# Patient Record
Sex: Female | Born: 1935 | Race: White | Hispanic: No | Smoking: Former smoker
Health system: Southern US, Community
[De-identification: ages and names within clinical notes are randomized; demographics above are authoritative.]

## PROBLEM LIST (undated history)

## (undated) DIAGNOSIS — R079 Chest pain, unspecified: Secondary | ICD-10-CM

## (undated) DIAGNOSIS — R0602 Shortness of breath: Secondary | ICD-10-CM

## (undated) DIAGNOSIS — I739 Peripheral vascular disease, unspecified: Secondary | ICD-10-CM

## (undated) DIAGNOSIS — E785 Hyperlipidemia, unspecified: Secondary | ICD-10-CM

## (undated) DIAGNOSIS — I10 Essential (primary) hypertension: Secondary | ICD-10-CM

## (undated) DIAGNOSIS — D649 Anemia, unspecified: Secondary | ICD-10-CM

## (undated) HISTORY — DX: Peripheral vascular disease, unspecified: I73.9

## (undated) HISTORY — DX: Chest pain, unspecified: R07.9

## (undated) HISTORY — DX: Hyperlipidemia, unspecified: E78.5

## (undated) HISTORY — DX: Essential (primary) hypertension: I10

## (undated) HISTORY — DX: Shortness of breath: R06.02

## (undated) HISTORY — DX: Anemia, unspecified: D64.9

---

## 1988-05-19 HISTORY — PX: CARDIAC CATHETERIZATION: SHX172

## 2013-03-07 HISTORY — PX: EYE SURGERY: SHX253

## 2015-11-26 ENCOUNTER — Ambulatory Visit (INDEPENDENT_AMBULATORY_CARE_PROVIDER_SITE_OTHER): Payer: Medicare Other | Admitting: Osteopathic Medicine

## 2015-11-26 ENCOUNTER — Ambulatory Visit (INDEPENDENT_AMBULATORY_CARE_PROVIDER_SITE_OTHER): Payer: Medicare Other

## 2015-11-26 ENCOUNTER — Encounter: Payer: Self-pay | Admitting: Osteopathic Medicine

## 2015-11-26 VITALS — BP 108/62 | HR 91 | Wt 132.0 lb

## 2015-11-26 DIAGNOSIS — I10 Essential (primary) hypertension: Secondary | ICD-10-CM

## 2015-11-26 DIAGNOSIS — I700 Atherosclerosis of aorta: Secondary | ICD-10-CM

## 2015-11-26 DIAGNOSIS — I517 Cardiomegaly: Secondary | ICD-10-CM | POA: Diagnosis not present

## 2015-11-26 DIAGNOSIS — I208 Other forms of angina pectoris: Secondary | ICD-10-CM

## 2015-11-26 DIAGNOSIS — R079 Chest pain, unspecified: Secondary | ICD-10-CM | POA: Diagnosis not present

## 2015-11-26 DIAGNOSIS — R0609 Other forms of dyspnea: Secondary | ICD-10-CM | POA: Diagnosis not present

## 2015-11-26 DIAGNOSIS — Z780 Asymptomatic menopausal state: Secondary | ICD-10-CM

## 2015-11-26 DIAGNOSIS — I272 Other secondary pulmonary hypertension: Secondary | ICD-10-CM

## 2015-11-26 DIAGNOSIS — I2089 Other forms of angina pectoris: Secondary | ICD-10-CM

## 2015-11-26 MED ORDER — NITROGLYCERIN 0.4 MG SL SUBL
0.4000 mg | SUBLINGUAL_TABLET | SUBLINGUAL | Status: DC | PRN
Start: 2015-11-26 — End: 2015-12-20

## 2015-11-26 MED ORDER — METOPROLOL SUCCINATE ER 50 MG PO TB24: 50.0000 mg | ORAL_TABLET | Freq: Every day | ORAL | Status: DC

## 2015-11-26 MED ORDER — NITROGLYCERIN 0.4 MG SL SUBL: 0.4000 mg | SUBLINGUAL_TABLET | SUBLINGUAL | Status: DC | PRN

## 2015-11-26 NOTE — Progress Notes (Signed)
HPI: Katrina Lane is a 82 y.o. Not Hispanic or Latino female  who presents to [facility] today, 11/26/2015,  for chief complaint of:  Chief Complaint  Patient presents with  . Establish Care    CHEST PAIN    SOB   . Location: substernal, nonradiating . Quality: SOB/CP on exertion . Duration: ongoign many months but worse in the past few weeks . Timing: "when trying to do anything." . Modifying factors: takes Anicin (ASA + caffeine) as needed for chest pain, No Rx for Nitro. "My cardiologist said if the Anicin helps, it's probably not my heart."  . Assoc signs/symptoms: chest pain on exertion as well  Patient is accompanied by daughter who assists with history-taking.   TOBACCO USE - >30 years, >1 ppd, quit around age 32's.   HTN - on Lisinopril-HCT 2 tabs daily  Past medical, surgical, social and family history reviewed: History reviewed. No pertinent past medical history. Past Surgical History  Procedure Laterality Date  . Eye surgery Right 03/07/2013    CATARACT  . Cardiac catheterization  1990   Social History  Substance Use Topics  . Smoking status: Former Smoker    Quit date: 09/25/1991  . Smokeless tobacco: Never Used  . Alcohol Use: Not on file   Family History  Problem Relation Age of Onset  . Diabetes Daughter   . Hypertension Daughter      Current medication list and allergy/intolerance information reviewed:   Current Outpatient Prescriptions  Medication Sig Dispense Refill  . atorvastatin (LIPITOR) 40 MG tablet Take by mouth. TAKE 1 TABLET BY MOUTH DAILY    . lisinopril-hydrochlorothiazide (PRINZIDE,ZESTORETIC) 20-12.5 MG tablet TAKE 2 TABLETS BY MOUTH DAILY     No current facility-administered medications for this visit.   No Known Allergies    Review of Systems:  Constitutional:  No  fever, no chills, No recent illness, No unintentional weight changes. No significant fatigue.   HEENT: No  headache, no vision  change, no hearing change, No sore throat, No  sinus pressure  Cardiac: (+) chest pain, (+) pressure, No palpitations, No  Orthopnea  Respiratory:  (+) shortness of breath. No  Cough  Gastrointestinal: No  abdominal pain, No  nausea, No  vomiting,  No  blood in stool, No  diarrhea, No  constipation   Musculoskeletal: No new myalgia/arthralgia  Genitourinary: No  incontinence, No  abnormal genital bleeding, No abnormal genital discharge  Skin: No  Rash, No other wounds/concerning lesions  Hem/Onc: No  easy bruising/bleeding, No  abnormal lymph node  Endocrine: No cold intolerance,  No heat intolerance. No polyuria/polydipsia/polyphagia   Neurologic: No  weakness, No  dizziness, No  slurred speech/focal weakness/facial droop  Psychiatric: No  concerns with depression, No  concerns with anxiety, No sleep problems, No mood problems  Exam:  BP 108/62 mmHg  Pulse 91  Wt 132 lb (59.875 kg)  Constitutional: VS see above. General Appearance: alert, well-developed, well-nourished, NAD  Eyes: Normal lids and conjunctive, non-icteric sclera  Ears, Nose, Mouth, Throat: MMM, Normal external inspection ears/nares/mouth/lips/gums. TM normal bilaterally. Pharynx/tonsils no erythema, no exudate. Nasal mucosa normal.   Neck: No   masses, trachea midline. No thyroid enlargement. No tenderness/mass appreciated. No lymphadenopathy  Respiratory: Normal respiratory effort. no wheeze, no rhonchi, no rales  Cardiovascular: S1/S2 normal, no murmur, no rub/gallop auscultated. RRR. No lower extremity edema. Pedal pulse II/IV bilaterally DP and PT. No carotid bruit or JVD. No abdominal aortic bruit.  Gastrointestinal: Nontender,  no masses. No hepatomegaly, no splenomegaly. No hernia appreciated. Bowel sounds normal. Rectal exam deferred.   Musculoskeletal: Gait normal. No clubbing/cyanosis of digits.   Neurological: No cranial nerve deficit on limited exam. Motor and sensation intact and symmetric.  Cerebellar reflexes intact. Normal balance/coordination. No tremor.   Skin: warm, dry, intact. No rash/ulcer. No concerning nevi or subq nodules on limited exam.    Psychiatric: Normal judgment/insight. Normal mood and affect. Oriented x3.   EKG interpretation: Rate: 92  Rhythm: Sinus Mild ST depression V3, V4 Called cardiologist office for EKG reports, transferred to medical records- stated none available.    Dg Chest 2 View  11/26/2015  CLINICAL DATA:  82 year old female with shortness of breath on exertion for 6 months. Initial encounter. EXAM: CHEST  2 VIEW COMPARISON:  None. FINDINGS: Calcified aortic atherosclerosis. Mild cardiomegaly. Other mediastinal contours are within normal limits. Visualized tracheal air column is within normal limits. Lung volumes are within normal limits. No pneumothorax, pulmonary edema, or pleural effusion. No confluent pulmonary opacity. Osteopenia. No acute osseous abnormality identified. IMPRESSION: Extensive calcified aortic atherosclerosis. Mild cardiomegaly. No acute cardiopulmonary abnormality. Electronically Signed   By: Odessa FlemingH  Hall M.D.   On: 11/26/2015 15:46  CXR personally reviewed, nothing to add to above interpretation   Records reviewed from most recent cardiology visit, Dr. Recardo Evangelistharles Harris, 04/05/2015. Previous diagnosis of dyspnea on exertion, aortic heart murmur, moderate to severe pulmonary hypertension, mixed hyperlipidemia, and essential hypertension.  Noted aortic insufficiency, aortic sclerosis without stenosis. Normal LV function. Last echocardiogram August 2015. I am unable to review the official report.  History of abnormal ABI in 2006 (previously on Pletal, patient states she stopped taking this medication and has not had any claudication symptoms)  normal Cardiolite in 2005     ASSESSMENT/PLAN:   Stable angina, EKG abnormal but unable to confirm with old records. Probably doesn't weren't ER referral at this time, however  precautions were reviewed extensively with patient and her daughter while we await rest of the workup  I'm concerned about worsening aortic valve sclerosis/possible stenosis, pulmonary hypertension.   We'll forward this note to cardiology, request their recommendation regarding patient follow-up/possible stress test as well as management for pulmonary hypertension.  Chest pain, unspecified chest pain type - Stable over past few weeks, ER precautions reviewed, prescription for nitroglycerin - Plan: EKG, DG Chest 2 View, B Nat Peptide, CBC with Differential/Platelet, COMPLETE METABOLIC PANEL WITH GFR, TSH, Lipid panel  Dyspnea on exertion - No clinical heart failure at this time, consideration for pulmonary hypertension/aortic valve etiology. Repeat echocardiogram and BNP as well as other labs pend - Plan: DG Chest 2 View, B Nat Peptide  Angina of effort (HCC) - Continue daily aspirin, prescription for nitroglycerin written when necessary chest pain, echo pending, would recommend stress test per cardiology - Plan: DG Chest 2 View, nitroGLYCERIN (NITROSTAT) 0.4 MG SL tablet, metoprolol succinate (TOPROL-XL) 50 MG 24 hr tablet, DISCONTINUED: nitroGLYCERIN (NITROSTAT) 0.4 MG SL tablet, DISCONTINUED: metoprolol succinate (TOPROL-XL) 50 MG 24 hr tablet  Pulmonary hypertension (HCC) - Echocardiogram pending, consideration for diuretic initiation, get labs today, also decrease lisinopril HCTZ and add a beta blocker  Postmenopausal - Plan: VITAMIN D 25 Hydroxy (Vit-D Deficiency, Fractures)  Essential hypertension - Decrease lisinopril HCTZ, beta blocker added, likely will add diuretic as well versus defer to cardiology, labs pending - Plan: CBC with Differential/Platelet, COMPLETE METABOLIC PANEL WITH GFR, TSH, Lipid panel     Visit summary with medication list and pertinent instructions was printed  for patient to review. All questions at time of visit were answered - patient instructed to contact office  with any additional concerns. ER/RTC precautions were reviewed with the patient. Follow-up plan: Return in about 3 days (around 11/29/2015), or sooner if symptoms worsen or fail to improve, for RECHECK SYMPTOMS AND BLOOD PRESSURE (OV30).  Note: Total time spent 45 minutes, greater than 50% of the visit was spent face-to-face counseling and coordinating care for the following: chest pain, angina, pulm HTN, hypertension

## 2015-11-26 NOTE — Patient Instructions (Addendum)
1. Medication change for blood pressure and hearr health: take 1 pill of the Lisinopril-HCTZ instead of 2 pills, and we are adding a medicine called Metoprolol. Depending on your blood pressure, on lab results, and on your echocardiogram results, we may also add a water pill in the future. 2. Sent prescription for Nitroglycerin tablets - take these when you are having chest pain. If chest pain does not resolve with 2 of these tablets, please seek medical attention 3. We are going to get an ultrasound of the heart - this will give [place] more information on the valves, the lung pressures, and your heart strength overall, which may affect how we treat you.  4. I will contact your cardiologist about getting a stress test set up.  5. Please return to the office in the next 3-4 days for recheck of your blood pressure on the new medications and to check up on your symptoms. If you get worse in the meantime, particularly chest pain that doesn't go away or severe shortness of breath, please call 911/go to the emergency department     Angina Pectoris Angina pectoris, often called angina, is extreme discomfort in the chest, neck, or arm. This is caused by a lack of blood in the middle and thickest layer of the heart wall (myocardium). There are four types of angina:  Stable angina. Stable angina usually occurs in episodes of predictable frequency and duration. It is usually brought on by physical activity, stress, or excitement. Stable angina usually lasts a few minutes and can often be relieved by a medicine that you place under your tongue. This medicine is called sublingual nitroglycerin.  Unstable angina. Unstable angina can occur even when you are doing little or no physical activity. It can even occur while you are sleeping or when you are at rest. It can suddenly increase in severity or frequency. It may not be relieved by sublingual nitroglycerin, and it can last up to 30 minutes.  Microvascular angina.  This type of angina is caused by a disorder of tiny blood vessels called arterioles. Microvascular angina is more common in women. The pain may be more severe and last longer than other types of angina pectoris.  Prinzmetal or variant angina. This type of angina pectoris is rare and usually occurs when you are doing little or no physical activity. It especially occurs in the early morning hours. CAUSES Atherosclerosis is the cause of angina. This is the buildup of fat and cholesterol (plaque) on the inside of the arteries. Over time, the plaque may narrow or block the artery, and this will lessen blood flow to the heart. Plaque can also become weak and break off within a coronary artery to form a clot and cause a sudden blockage. RISK FACTORS Risk factors common to both men and women include:  High cholesterol levels.  High blood pressure (hypertension).  Tobacco use.  Diabetes.  Family history of angina.  Obesity.  Lack of exercise.  A diet high in saturated fats. Women are at greater risk for angina if they are:  Over age 55.  Postmenopausal. SYMPTOMS Many people do not experience any symptoms during the early stages of angina. As the condition progresses, symptoms common to both men and women may include:  Chest pain.  The pain can be described as a crushing or squeezing in the chest, or a tightness, pressure, fullness, or heaviness in the chest.  The pain can last more than a few minutes, or it can stop and  recur.  Pain in the arms, neck, jaw, or back.  Unexplained heartburn or indigestion.  Shortness of breath.  Nausea.  Sudden cold sweats.  Sudden light-headedness. Many women have chest discomfort and some of the other symptoms. However, women often have different (atypical) symptoms, such as:   Fatigue.  Unexplained feelings of nervousness or anxiety.  Unexplained weakness.  Dizziness or fainting. Sometimes, women may have angina without any  symptoms. DIAGNOSIS  Tests to diagnose angina may include:  ECG (electrocardiogram).  Exercise stress test. This looks for signs of blockage when the heart is being exercised.  Pharmacologic stress test. This test looks for signs of blockage when the heart is being stressed with a medicine.  Blood tests.  Coronary angiogram. This is a procedure to look at the coronary arteries to see if there is any blockage. TREATMENT  The treatment of angina may include the following:  Healthy behavioral changes to reduce or control risk factors.  Medicine.  Coronary stenting.A stent helps to keep an artery open.  Coronary angioplasty. This procedure widens a narrowed or blocked artery.  Coronary arterybypass surgery. This will allow your blood to pass the blockage (bypass) to reach your heart. HOME CARE INSTRUCTIONS   Take medicines only as directed by your health care provider.  Do not take the following medicines unless your health care provider approves:  Nonsteroidal anti-inflammatory drugs (NSAIDs), such as ibuprofen, naproxen, or celecoxib.  Vitamin supplements that contain vitamin A, vitamin E, or both.  Hormone replacement therapy that contains estrogen with or without progestin.  Manage other health conditions such as hypertension and diabetes as directed by your health care provider.  Follow a heart-healthy diet. A dietitian can help to educate you about healthy food options and changes.  Use healthy cooking methods such as roasting, grilling, broiling, baking, poaching, steaming, or stir-frying. Talk to a dietitian to learn more about healthy cooking methods.  Follow an exercise program approved by your health care provider.  Maintain a healthy weight. Lose weight as approved by your health care provider.  Plan rest periods when fatigued.  Learn to manage stress.  Do not use any tobacco products, including cigarettes, chewing tobacco, or electronic cigarettes. If you  need help quitting, ask your health care provider.  If you drink alcohol, and your health care provider approves, limit your alcohol intake to no more than 1 drink per day. One drink equals 12 ounces of beer, 5 ounces of wine, or 1 ounces of hard liquor.  Stop illegal drug use.  Keep all follow-up visits as directed by your health care provider. This is important. SEEK IMMEDIATE MEDICAL CARE IF:   You have pain in your chest, neck, arm, jaw, stomach, or back that lasts more than a few minutes, is recurring, or is unrelieved by taking sublingualnitroglycerin.  You have profuse sweating without cause.  You have unexplained:  Heartburn or indigestion.  Shortness of breath or difficulty breathing.  Nausea or vomiting.  Fatigue.  Feelings of nervousness or anxiety.  Weakness.  Diarrhea.  You have sudden light-headedness or dizziness.  You faint. These symptoms may represent a serious problem that is an emergency. Do not wait to see if the symptoms will go away. Get medical help right away. Call your local emergency services (911 in the [place] Do not drive yourself to the hospital.   This information is not intended to replace advice given to you by your health care provider. Make sure you discuss any questions you have with  your health care provider.   Document Released: 05/05/2005 Document Revised: 05/26/2014 Document Reviewed: 09/06/2013 Elsevier Interactive Patient Education [employer]

## 2015-11-27 ENCOUNTER — Telehealth: Payer: Self-pay | Admitting: Osteopathic Medicine

## 2015-11-27 LAB — COMPLETE METABOLIC PANEL WITH GFR
ALT: 7 U/L (ref 6–29)
AST: 15 U/L (ref 10–35)
Albumin: 4.1 g/dL (ref 3.6–5.1)
Alkaline Phosphatase: 50 U/L (ref 33–130)
BUN: 30 mg/dL — ABNORMAL HIGH (ref 7–25)
CALCIUM: 9 mg/dL (ref 8.6–10.4)
CHLORIDE: 106 mmol/L (ref 98–110)
CO2: 21 mmol/L (ref 20–31)
CREATININE: 1.59 mg/dL — AB (ref 0.60–0.88)
GFR, EST AFRICAN AMERICAN: 34 mL/min — AB (ref >=60)
GFR, Est Non African American: 30 mL/min — ABNORMAL LOW (ref >=60)
Glucose, Bld: 108 mg/dL — ABNORMAL HIGH (ref 65–99)
POTASSIUM: 4.3 mmol/L (ref 3.5–5.3)
Sodium: 139 mmol/L (ref 135–146)
Total Bilirubin: 0.4 mg/dL (ref 0.2–1.2)
Total Protein: 7.5 g/dL (ref 6.1–8.1)

## 2015-11-27 LAB — BRAIN NATRIURETIC PEPTIDE: Brain Natriuretic Peptide: 746.9 pg/mL — ABNORMAL HIGH (ref <100)

## 2015-11-27 LAB — LIPID PANEL
CHOL/HDL RATIO: 4 ratio (ref <=5.0)
CHOLESTEROL: 194 mg/dL (ref 125–200)
HDL: 49 mg/dL (ref >=46)
LDL Cholesterol: 126 mg/dL (ref <130)
Triglycerides: 95 mg/dL (ref <150)
VLDL: 19 mg/dL (ref <30)

## 2015-11-27 LAB — CBC WITH DIFFERENTIAL/PLATELET
BASOS ABS: 77 {cells}/uL (ref 0–200)
Basophils Relative: 1 %
EOS ABS: 154 {cells}/uL (ref 15–500)
Eosinophils Relative: 2 %
HEMATOCRIT: 25.2 % — AB (ref 35.0–45.0)
Hemoglobin: 6.6 g/dL — CL (ref 11.7–15.5)
LYMPHS PCT: 24 %
Lymphs Abs: 1848 cells/uL (ref 850–3900)
MCH: 17.4 pg — AB (ref 27.0–33.0)
MCHC: 26.2 g/dL — AB (ref 32.0–36.0)
MCV: 66.5 fL — AB (ref 80.0–100.0)
MONO ABS: 616 {cells}/uL (ref 200–950)
MPV: 10 fL (ref 7.5–12.5)
Monocytes Relative: 8 %
NEUTROS PCT: 65 %
Neutro Abs: 5005 cells/uL (ref 1500–7800)
Platelets: 424 10*3/uL — ABNORMAL HIGH (ref 140–400)
RBC: 3.79 MIL/uL — ABNORMAL LOW (ref 3.80–5.10)
RDW: 17.6 % — AB (ref 11.0–15.0)
WBC: 7.7 10*3/uL (ref 3.8–10.8)

## 2015-11-27 LAB — TSH: TSH: 0.56 m[IU]/L

## 2015-11-27 LAB — VITAMIN D 25 HYDROXY (VIT D DEFICIENCY, FRACTURES): Vit D, 25-Hydroxy: 12 ng/mL — ABNORMAL LOW (ref 30–100)

## 2015-11-27 NOTE — Telephone Encounter (Signed)
-----   Message from Sunnie NielsenNatalie Alexander, DO sent at 11/26/2015  6:03 PM EDT ----- Regarding: cardiology [facility], I thought I could route notes directly to them but I don't think there is any way to do that outside the [facility]. Cardiologist's name is Recardo Evangelistharles Harris, M.D. Any way we can get these notes to him? Concern patient needs to be set up for stress test but I don't think she officially needs a referral - if she does let me know. Thanks in advance!

## 2015-11-27 NOTE — Telephone Encounter (Signed)
Office notes, labs, ekg, and xray faxed to cardiologist Dr. Tiburcio PeaHarris.

## 2015-11-28 NOTE — Addendum Note (Signed)
Addended by: Collie SiadICHARDSON, Elroy Schembri M on: 11/28/2015 11:11 AM   Modules accepted: Orders

## 2015-11-30 ENCOUNTER — Ambulatory Visit: Payer: Medicare Other | Admitting: Osteopathic Medicine

## 2015-12-20 ENCOUNTER — Ambulatory Visit (INDEPENDENT_AMBULATORY_CARE_PROVIDER_SITE_OTHER): Payer: Medicare Other | Admitting: Osteopathic Medicine

## 2015-12-20 ENCOUNTER — Encounter: Payer: Self-pay | Admitting: Osteopathic Medicine

## 2015-12-20 VITALS — BP 108/71 | HR 72 | Ht 62.0 in | Wt 128.0 lb

## 2015-12-20 DIAGNOSIS — D649 Anemia, unspecified: Secondary | ICD-10-CM | POA: Diagnosis not present

## 2015-12-20 DIAGNOSIS — I272 Other secondary pulmonary hypertension: Secondary | ICD-10-CM

## 2015-12-20 DIAGNOSIS — I10 Essential (primary) hypertension: Secondary | ICD-10-CM | POA: Diagnosis not present

## 2015-12-20 MED ORDER — LISINOPRIL 10 MG PO TABS: 10.0000 mg | ORAL_TABLET | Freq: Every day | ORAL | 1 refills | Status: DC

## 2015-12-20 NOTE — Progress Notes (Signed)
HPI: Katrina Lane is a 82 y.o. Not Hispanic or Latino female  who presents to [facility] today, 12/20/15,  for chief complaint of:  Chief Complaint  Patient presents with  . Hospitalization Follow-up    BLOOD TRANSFUSION    Patient initially seen by me about a month ago for chest pain/shortness of breath, found to be significantly anemic, hospitalized, transfuse 2 units PRBC, declined GI workup, still declines GI workup even with nausea we may be missing GI bleed/cancer or other concern.  She is doing well today, no complaints. Blood pressure was a bit high on initial check but then lower on recheck, patient not complaining of any dizziness or serious fatigue. Rather not change her blood pressure medications at this point unless we really have to.  BP - 124/70 in ER 12/08/15  Patient is accompanied by daughter who assists with history-taking.     Past medical, surgical, social and family history reviewed: No past medical history on file. Past Surgical History:  Procedure Laterality Date  . CARDIAC CATHETERIZATION  1990  . EYE SURGERY Right 03/07/2013   CATARACT   Social History  Substance Use Topics  . Smoking status: Former Smoker    Quit date: 09/25/1991  . Smokeless tobacco: Never Used  . Alcohol use Not on file   Family History  Problem Relation Age of Onset  . Diabetes Daughter   . Hypertension Daughter      Current medication list and allergy/intolerance information reviewed:   Current Outpatient Prescriptions  Medication Sig Dispense Refill  . metoprolol succinate (TOPROL-XL) 50 MG 24 hr tablet Take 1 tablet (50 mg total) by mouth daily. Take with or immediately following a meal. 30 tablet 1  . aspirin 81 MG tablet Take 81 mg by mouth daily.    Marland Kitchen atorvastatin (LIPITOR) 40 MG tablet Take 40 mg by mouth daily. TAKE 1 TABLET BY MOUTH DAILY     No current facility-administered medications for this visit.    No Known Allergies     Review of Systems:  Constitutional:  No  fever, no chills, No recent illness, No unintentional weight changes. No significant fatigue.   HEENT: No  headache, no vision change  Cardiac: No  chest pain, No  pressure, No palpitations, No  Orthopnea  Respiratory:  No  shortness of breath. No  Cough  Gastrointestinal: No  abdominal pain, No  blood in stool  Musculoskeletal: No new myalgia/arthralgia  Neurologic: No  weakness, No  Dizziness  Exam:  BP 108/71   Pulse 72   Ht  (1.575 m)   Wt 128 lb (58.1 kg)   BMI 23.41 kg/m   Constitutional: VS see above. General Appearance: alert, well-developed, well-nourished, NAD  Eyes: Normal lids and conjunctive, non-icteric sclera  Ears, Nose, Mouth, Throat: MMM,   Respiratory: Normal respiratory effort. no wheeze, no rhonchi, no rales  Cardiovascular: S1/S2 normal, (+) 2/6 systolic murmur, no rub/gallop auscultated. RRR. No JVD  Musculoskeletal: Gait normal.   Neurological:  Normal balance/coordination. No tremor.   Psychiatric: Normal judgment/insight. Normal mood and affect. Oriented x3.    Echocardiogram 11/28/2015: Or asymmetric LVH, moderately severe tricuspid regurg, normal EF 60-65%, mild mitral stenosis, unable to determine diastolic dysfunction, right ventricular systolic pressure 50-60 mmHg, consistent with moderately severe pulmonary hypertension.   ASSESSMENT/PLAN:   Plan to recheck blood pressure in about 4 weeks, patient probably should be on diuretic medication give and pulmonary hypertension, but she's not particularly keen on changing  her medicines at this point, will defer to cardiology/ discuss at follow-up visit given that the patient's symptoms have resolved at this point.  Anemia: Plan to recheck at next visit, hemoglobin improving status post 2 units PRBC and with iron, patient has declined GI workup despite discussion of risk versus benefit, knowledge that we may be missing serious diagnosis that shows  ulceration, slow GI bleed, cancer, other. She absolutely does not want to go through a colonoscopy.  Overall patient doing well, getting to appointment with me is an issue for her. I told her daughter that I am happy to fill out FMLA forms for family members to them from work to provide transportation/accompanied to appointment if she would like.  Essential hypertension - Plan: DISCONTINUED: lisinopril (PRINIVIL,ZESTRIL) 10 MG tablet  Pulmonary hypertension (HCC) - Plan: Ambulatory referral to Cardiology  Anemia, unspecified anemia type    Visit summary with medication list and pertinent instructions was printed for patient to review. All questions at time of visit were answered - patient instructed to contact office with any additional concerns. ER/RTC precautions were reviewed with the patient. Follow-up plan: Return in about 4 weeks (around 01/17/2016), or sooner if needed, for RECHECK BLOOD PRESSURE AND LABS.  Note: Total time spent 25 minutes, greater than 50% of the visit was spent face-to-face counseling and coordinating care for the following: The primary encounter diagnosis was Essential hypertension. A diagnosis of Pulmonary hypertension (HCC) was also pertinent to this visit.Marland Kitchen

## 2015-12-21 DIAGNOSIS — D649 Anemia, unspecified: Secondary | ICD-10-CM | POA: Insufficient documentation

## 2015-12-24 ENCOUNTER — Other Ambulatory Visit: Payer: Self-pay | Admitting: Osteopathic Medicine

## 2015-12-24 DIAGNOSIS — I208 Other forms of angina pectoris: Secondary | ICD-10-CM

## 2016-01-09 ENCOUNTER — Other Ambulatory Visit: Payer: Self-pay | Admitting: Osteopathic Medicine

## 2016-01-24 ENCOUNTER — Ambulatory Visit: Payer: Medicare Other | Admitting: Osteopathic Medicine

## 2016-02-08 ENCOUNTER — Ambulatory Visit (INDEPENDENT_AMBULATORY_CARE_PROVIDER_SITE_OTHER): Payer: Medicare Other | Admitting: Osteopathic Medicine

## 2016-02-08 VITALS — BP 202/92 | HR 87 | Wt 131.0 lb

## 2016-02-08 DIAGNOSIS — I272 Other secondary pulmonary hypertension: Secondary | ICD-10-CM | POA: Diagnosis not present

## 2016-02-08 DIAGNOSIS — I10 Essential (primary) hypertension: Secondary | ICD-10-CM | POA: Diagnosis not present

## 2016-02-08 LAB — COMPLETE METABOLIC PANEL WITH GFR
ALBUMIN: 3.7 g/dL (ref 3.6–5.1)
ALK PHOS: 64 U/L (ref 33–130)
ALT: 10 U/L (ref 6–29)
AST: 20 U/L (ref 10–35)
BILIRUBIN TOTAL: 0.6 mg/dL (ref 0.2–1.2)
BUN: 19 mg/dL (ref 7–25)
CO2: 25 mmol/L (ref 20–31)
Calcium: 8.6 mg/dL (ref 8.6–10.4)
Chloride: 105 mmol/L (ref 98–110)
Creat: 1.05 mg/dL — ABNORMAL HIGH (ref 0.60–0.88)
GFR, EST AFRICAN AMERICAN: 56 mL/min — AB (ref >=60)
GFR, EST NON AFRICAN AMERICAN: 49 mL/min — AB (ref >=60)
GLUCOSE: 108 mg/dL — AB (ref 65–99)
POTASSIUM: 4.2 mmol/L (ref 3.5–5.3)
SODIUM: 141 mmol/L (ref 135–146)
TOTAL PROTEIN: 7.2 g/dL (ref 6.1–8.1)

## 2016-02-08 MED ORDER — ATORVASTATIN CALCIUM 20 MG PO TABS: 20.0000 mg | ORAL_TABLET | Freq: Every day | ORAL | 3 refills | Status: DC

## 2016-02-08 MED ORDER — FUROSEMIDE 20 MG PO TABS: 20.0000 mg | ORAL_TABLET | Freq: Two times a day (BID) | ORAL | 3 refills | Status: DC

## 2016-02-08 NOTE — Patient Instructions (Addendum)
CARDIOLOGIST - Dr Jens Somrenshaw [phone]  Any chest pain, difficulty breathing, or other concerns over the weekend - go to ER!

## 2016-02-08 NOTE — Progress Notes (Signed)
HPI: Katrina Lane is a 82 y.o. Not Hispanic or Latino female  who presents to [facility] [place] today, 02/08/16,  for chief complaint of:  Chief Complaint  Patient presents with  . Hypertension    denies dizziness, headache, and blurred vision     HTN:  Patient initially seen by me 11/26/15 for chest pain/shortness of breath, found to be significantly anemic, hospitalized, transfuse 2 units PRBC, declined GI workup. BP - 124/70 in ER 12/08/15. At visit w/ me 12/20/15 Blood pressure was 108/71 and lisinopril D/C. Instructed to follow up in 4 weeks, it's been about 7 weeks. Patient was also instructed to contact us if she did not hear back from cardiology referral for pulmonary hypertension. Notes indicate that this office tried to contact her on multiple attempts. BP today significantly high as below. Patient denies dizziness, nausea, headache. She is not taking home blood pressure measurements.  Patient is accompanied by daughter who assists with history-taking.   re HTN at last visit  "Plan to recheck blood pressure in about 4 weeks, patient probably should be on diuretic medication given pulmonary hypertension, but she's not particularly keen on changing her medicines at this point, will defer to cardiology/ discuss at follow-up visit given that the patient's symptoms have resolved at this point."  Past medical, surgical, social and family history reviewed: No past medical history on file. Past Surgical History:  Procedure Laterality Date  . CARDIAC CATHETERIZATION  1990  . EYE SURGERY Right 03/07/2013   CATARACT   Social History  Substance Use Topics  . Smoking status: Former Smoker    Quit date: 09/25/1991  . Smokeless tobacco: Never Used  . Alcohol use Not on file   Family History  Problem Relation Age of Onset  . Diabetes Daughter   . Hypertension Daughter      Current medication list and allergy/intolerance information reviewed:   Current  Outpatient Prescriptions  Medication Sig Dispense Refill  . aspirin 81 MG tablet Take 81 mg by mouth daily.    . ferrous sulfate 325 (65 FE) MG tablet TAKE 1 TABLET(325 MG) BY MOUTH TWICE DAILY 60 tablet 0  . metoprolol succinate (TOPROL-XL) 50 MG 24 hr tablet Take 1 tablet (50 mg total) by mouth daily. Take with or immediately following a meal. 30 tablet 1  . metoprolol succinate (TOPROL-XL) 50 MG 24 hr tablet Take 1 tablet by mouth  daily with or immediatley  following a meal 90 tablet 1   No current facility-administered medications for this visit.    No Known Allergies    Review of Systems:  Constitutional:  No unintentional weight changes. No significant fatigue.   HEENT: No  headache, no vision change  Cardiac: No  chest pain, No  pressure, No palpitations, No  Orthopnea  Respiratory:  +shortness of breath on exertion. No  Cough  Gastrointestinal: No  abdominal pain, No  blood in stool  Neurologic: No  weakness, No  Dizziness  Exam:  BP (!) 202/92   Pulse 87   Wt 131 lb (59.4 kg)   BMI 23.96 kg/m  blood pressure confirmed on manual recheck  Constitutional: VS see above. General Appearance: alert, well-developed, well-nourished, NAD  Eyes: Normal lids and conjunctive, non-icteric sclera  Ears, Nose, Mouth, Throat: MMM,   Respiratory: Normal respiratory effort. no wheeze, no rhonchi, no rales  Cardiovascular: S1/S2 normal, (+) 2/6 systolic murmur, no rub/gallop auscultated. RRR. No JVD  Musculoskeletal: Gait normal.   Neurological:  Normal  balance/coordination. No tremor.   Psychiatric: Normal judgment/insight. Normal mood and affect. Oriented x3.    Echocardiogram 11/28/2015: asymmetric LVH, moderately severe tricuspid regurg, normal EF 60-65%, mild mitral stenosis, unable to determine diastolic dysfunction, right ventricular systolic pressure 50-60 mmHg, consistent with moderately severe pulmonary hypertension.   ASSESSMENT/PLAN:   Initiate diuretic  medication. Hopefully this will help with pulmonary hypertension, however she really needs to be seen by cardiology. Patient's daughter was given phone number to call to set up appointment. Let us know if any problems getting this arranged. ER precautions were reviewed. Furthermore, patient is unclear on several of her medications, she is asked to bring all of her home medications to her next visit.  Essential hypertension - Plan: B Nat Peptide, COMPLETE METABOLIC PANEL WITH GFR  Pulmonary hypertension (HCC) - Plan: furosemide (LASIX) 20 MG tablet    Visit summary with medication list and pertinent instructions was printed for patient to review. All questions at time of visit were answered - patient instructed to contact office with any additional concerns. ER/RTC precautions were reviewed with the patient. Follow-up plan: Return in about 3 days (around 02/11/2016) for BLOOD PRESSURE RECHECK IN A.M..  Note: Total time spent 25 minutes, greater than 50% of the visit was spent face-to-face counseling and coordinating care for the following: There were no encounter diagnoses.Marland Kitchen

## 2016-02-09 LAB — BRAIN NATRIURETIC PEPTIDE: Brain Natriuretic Peptide: 868.2 pg/mL — ABNORMAL HIGH (ref <100)

## 2016-02-11 ENCOUNTER — Ambulatory Visit (INDEPENDENT_AMBULATORY_CARE_PROVIDER_SITE_OTHER): Payer: Medicare Other | Admitting: Osteopathic Medicine

## 2016-02-11 VITALS — BP 145/79 | HR 80 | Wt 125.0 lb

## 2016-02-11 DIAGNOSIS — I272 Other secondary pulmonary hypertension: Secondary | ICD-10-CM | POA: Diagnosis not present

## 2016-02-11 DIAGNOSIS — I10 Essential (primary) hypertension: Secondary | ICD-10-CM

## 2016-02-11 DIAGNOSIS — R7981 Abnormal blood-gas level: Secondary | ICD-10-CM

## 2016-02-11 NOTE — Progress Notes (Signed)
Blood pressure is improved on Lasix and patient's weight has also come down a bit. She states a little bit better breathing but still occasional shortness of breath. SaO2 was 94%, I was asked to see the patient by the nurse. We do not have any previous oxygen saturation levels for her but she states no dyspnea. History of smoking, possible COPD and chronic low oxygen saturation. Lungs are clear to auscultation bilaterally, heart regular rate rhythm. Need to get CBC at any rate to follow-up anemia. Other labs reviewed, BMP was high. Patient still needs to follow-up with cardiologist regarding pulmonary hypertension but I believe patient is stable and okay to go at this time, all questions have been answered, patient to follow-up with me in 1 week ER/RTC precautions were reviewed.

## 2016-02-11 NOTE — Progress Notes (Signed)
Pt came into clinic today for BP follow up. Pt was recently started on Lasix and states her shortness of breath is "better." While in clinic her o2 level was low, PCP was able to come in and evaluate Pt. Pt advised to follow up in 1 week with PCP. No medication changes at this time, new lab orders placed. No further questions.

## 2016-02-12 LAB — CBC WITH DIFFERENTIAL/PLATELET
BASOS ABS: 0 {cells}/uL (ref 0–200)
Basophils Relative: 0 %
Eosinophils Absolute: 222 cells/uL (ref 15–500)
Eosinophils Relative: 3 %
HEMATOCRIT: 46.6 % — AB (ref 35.0–45.0)
HEMOGLOBIN: 14.7 g/dL (ref 11.7–15.5)
LYMPHS ABS: 2294 {cells}/uL (ref 850–3900)
Lymphocytes Relative: 31 %
MCH: 26.9 pg — ABNORMAL LOW (ref 27.0–33.0)
MCHC: 31.5 g/dL — AB (ref 32.0–36.0)
MCV: 85.3 fL (ref 80.0–100.0)
MONO ABS: 888 {cells}/uL (ref 200–950)
Monocytes Relative: 12 %
NEUTROS PCT: 54 %
Neutro Abs: 3996 cells/uL (ref 1500–7800)
Platelets: 208 10*3/uL (ref 140–400)
RBC: 5.46 MIL/uL — AB (ref 3.80–5.10)
RDW: 24.8 % — ABNORMAL HIGH (ref 11.0–15.0)
WBC: 7.4 10*3/uL (ref 3.8–10.8)

## 2016-02-22 ENCOUNTER — Ambulatory Visit: Payer: Medicare Other | Admitting: Osteopathic Medicine

## 2016-02-27 ENCOUNTER — Encounter: Payer: Self-pay | Admitting: Osteopathic Medicine

## 2016-02-27 ENCOUNTER — Ambulatory Visit (INDEPENDENT_AMBULATORY_CARE_PROVIDER_SITE_OTHER): Payer: Medicare Other | Admitting: Osteopathic Medicine

## 2016-02-27 ENCOUNTER — Ambulatory Visit: Payer: Medicare Other | Admitting: Cardiology

## 2016-02-27 VITALS — BP 110/80 | HR 69 | Ht 62.0 in | Wt 127.0 lb

## 2016-02-27 DIAGNOSIS — R0609 Other forms of dyspnea: Secondary | ICD-10-CM | POA: Diagnosis not present

## 2016-02-27 DIAGNOSIS — I272 Pulmonary hypertension, unspecified: Secondary | ICD-10-CM

## 2016-02-27 DIAGNOSIS — I208 Other forms of angina pectoris: Secondary | ICD-10-CM | POA: Diagnosis not present

## 2016-02-27 DIAGNOSIS — I10 Essential (primary) hypertension: Secondary | ICD-10-CM | POA: Diagnosis not present

## 2016-02-27 MED ORDER — METOPROLOL SUCCINATE ER 25 MG PO TB24: 25.0000 mg | ORAL_TABLET | Freq: Every day | ORAL | 0 refills | Status: DC

## 2016-02-27 NOTE — Patient Instructions (Addendum)
Metoprolol (Toprol-XL) 50 mg tablets - cut in half to take 25 mg per day  Continue Furosemide (Lasix) 20 mg tablets twice per day Keep follow-up visit with cardiologist

## 2016-02-27 NOTE — Progress Notes (Signed)
HPI: Katrina Lane is a 82 y.o. female  who presents to [facility] today, 02/27/16,  for chief complaint of:  Chief Complaint  Patient presents with  . Follow-up    Follow-up on blood pressure recheck and labs on new medications. Patient overall feeling well. Blood pressure is a bit low both on automatic cuff and on manual cuff, patient does not complain of any dizziness/weakness. Has upcoming appointment with cardiology in 2 weeks.  Patient is accompanied by daughter who assists with history-taking.   Past medical, surgical, social and family history reviewed: Past Medical History:  Diagnosis Date  . Anemia    RECEIVED 2 UNITS OF PRBC  . Chest pain   . Hypertension    PULMONARY  . SOB (shortness of breath)    Past Surgical History:  Procedure Laterality Date  . CARDIAC CATHETERIZATION  1990  . EYE SURGERY Right 03/07/2013   CATARACT   Social History  Substance Use Topics  . Smoking status: Former Smoker    Quit date: 09/25/1991  . Smokeless tobacco: Never Used  . Alcohol use Not on file   Family History  Problem Relation Age of Onset  . Diabetes Daughter   . Hypertension Daughter      Current medication list and allergy/intolerance information reviewed:   Current Outpatient Prescriptions  Medication Sig Dispense Refill  . aspirin 81 MG tablet Take 81 mg by mouth daily.    Marland Kitchen atorvastatin (LIPITOR) 20 MG tablet Take 1 tablet (20 mg total) by mouth daily. TAKE 1 TABLET BY MOUTH DAILY 30 tablet 3  . ferrous sulfate 325 (65 FE) MG tablet TAKE 1 TABLET(325 MG) BY MOUTH TWICE DAILY 60 tablet 0  . furosemide (LASIX) 20 MG tablet Take 1 tablet (20 mg total) by mouth 2 (two) times daily. 30 tablet 3  . metoprolol succinate (TOPROL-XL) 50 MG 24 hr tablet Take 1 tablet by mouth  daily with or immediatley  following a meal 90 tablet 1   No current facility-administered medications for this visit.    No Known Allergies    Review of  Systems:  Constitutional:  No  fever, no chills, No recent illness, No unintentional weight changes. No significant fatigue.   HEENT: No  headache, no vision change  Cardiac: No  chest pain, No  pressure, No palpitations  Respiratory:  No  shortness of breath. No  Cough  Neurologic: No  weakness, No  dizziness,   Exam:  BP 100/63   Pulse 69   Ht 5\' 2"  (1.575 m)   Wt 127 lb (57.6 kg)   BMI 23.23 kg/m   Constitutional: VS see above. General Appearance: alert, well-developed, well-nourished, NAD  Ears, Nose, Mouth, Throat: MMM,   Neck: No masses, trachea midline  Respiratory: Normal respiratory effort. no wheeze, no rhonchi, no rales  Cardiovascular: S1/S2 normal, (+)2/6 systolic murmur, no rub/gallop auscultated. RRR. No lower extremity edema.   Psychiatric: Normal judgment/insight. Normal mood and affect. Oriented x3.     ASSESSMENT/PLAN:   Essential hypertension - Plan: CBC with Differential/Platelet, COMPLETE METABOLIC PANEL WITH GFR  Dyspnea on exertion  Pulmonary hypertension    Patient Instructions  Metoprolol (Toprol-XL) 50 mg tablets - cut in half to take 25 mg per day  Continue Furosemide (Lasix) 20 mg tablets twice per day Keep follow-up visit with cardiologist    Visit summary with medication list and pertinent instructions was printed for patient to review. All questions at time of visit were answered -  patient instructed to contact office with any additional concerns. ER/RTC precautions were reviewed with the patient. Follow-up plan: Return in about 6 months (around 08/27/2016) for ANNUAL PHYSICAL sooner if needed.

## 2016-02-28 ENCOUNTER — Other Ambulatory Visit: Payer: Self-pay | Admitting: Osteopathic Medicine

## 2016-02-28 LAB — CBC WITH DIFFERENTIAL/PLATELET
BASOS PCT: 1 %
Basophils Absolute: 82 cells/uL (ref 0–200)
EOS PCT: 2 %
Eosinophils Absolute: 164 cells/uL (ref 15–500)
HCT: 42.8 % (ref 35.0–45.0)
HEMOGLOBIN: 13.6 g/dL (ref 11.7–15.5)
LYMPHS ABS: 2706 {cells}/uL (ref 850–3900)
LYMPHS PCT: 33 %
MCH: 27.8 pg (ref 27.0–33.0)
MCHC: 31.8 g/dL — AB (ref 32.0–36.0)
MCV: 87.5 fL (ref 80.0–100.0)
Monocytes Absolute: 574 cells/uL (ref 200–950)
Monocytes Relative: 7 %
NEUTROS PCT: 57 %
Neutro Abs: 4674 cells/uL (ref 1500–7800)
PLATELETS: 256 10*3/uL (ref 140–400)
RBC: 4.89 MIL/uL (ref 3.80–5.10)
RDW: 19.5 % — AB (ref 11.0–15.0)
WBC: 8.2 10*3/uL (ref 3.8–10.8)

## 2016-02-28 LAB — COMPLETE METABOLIC PANEL WITH GFR
ALT: 13 U/L (ref 6–29)
AST: 23 U/L (ref 10–35)
Albumin: 3.7 g/dL (ref 3.6–5.1)
Alkaline Phosphatase: 67 U/L (ref 33–130)
BUN: 25 mg/dL (ref 7–25)
CHLORIDE: 102 mmol/L (ref 98–110)
CO2: 26 mmol/L (ref 20–31)
Calcium: 9.1 mg/dL (ref 8.6–10.4)
Creat: 1.24 mg/dL — ABNORMAL HIGH (ref 0.60–0.88)
GFR, Est African American: 46 mL/min — ABNORMAL LOW (ref >=60)
GFR, Est Non African American: 40 mL/min — ABNORMAL LOW (ref >=60)
GLUCOSE: 103 mg/dL — AB (ref 65–99)
POTASSIUM: 4.4 mmol/L (ref 3.5–5.3)
SODIUM: 138 mmol/L (ref 135–146)
Total Bilirubin: 0.5 mg/dL (ref 0.2–1.2)
Total Protein: 7.4 g/dL (ref 6.1–8.1)

## 2016-03-10 NOTE — Progress Notes (Signed)
HPI: 82 year old female for evaluation of chest pain and pulmonary hypertension. Previously followed at [facility]. Patient had a negative nuclear study in 2005. Chest x-ray July 2017 showed extensive aortic atherosclerosis and mild cardiomegaly. Echocardiogram July 2017 showed normal LV function, trace AI, mild left atrial enlargement, moderate mitral regurgitation, mild mitral stenosis, moderate pulmonary hypertension and moderate (3+) tricuspid regurgitation. Also with history of PAD treated medically. All records not available. In July the patient complained of fatigue, weakness, dyspnea and chest pain. Her hemoglobin was found to be markedly reduced at 6.6 MCV 66.5. She was treated with transfusions, IV iron and oral iron. She felt markedly better after this. She now denies dyspnea, chest pain or syncope. She refused colonoscopy. Her last hemoglobin on October 11 was 13.6.   Current Outpatient Prescriptions  Medication Sig Dispense Refill  . aspirin 81 MG tablet Take 81 mg by mouth daily.    Marland Kitchen. atorvastatin (LIPITOR) 20 MG tablet Take 1 tablet (20 mg total) by mouth daily. TAKE 1 TABLET BY MOUTH DAILY 30 tablet 3  . ferrous sulfate 325 (65 FE) MG tablet TAKE 1 TABLET(325 MG) BY MOUTH TWICE DAILY 60 tablet 0  . furosemide (LASIX) 20 MG tablet Take 1 tablet (20 mg total) by mouth 2 (two) times daily. 30 tablet 3  . metoprolol succinate (TOPROL-XL) 50 MG 24 hr tablet Take 25 mg by mouth daily.      No current facility-administered medications for this visit.     No Known Allergies   Past Medical History:  Diagnosis Date  . Anemia    RECEIVED 2 UNITS OF PRBC  . Chest pain   . Hyperlipidemia   . Hypertension    PULMONARY  . PVD (peripheral vascular disease) (HCC)   . SOB (shortness of breath)     Past Surgical History:  Procedure Laterality Date  . CARDIAC CATHETERIZATION  1990  . EYE SURGERY Right 03/07/2013   CATARACT    Social History   Social History  . Marital status:  Widowed    Spouse name: N/A  . Number of children: 4  . Years of education: N/A   Occupational History  . Not on file.   Social History Main Topics  . Smoking status: Former Smoker    Quit date: 09/25/1991  . Smokeless tobacco: Never Used  . Alcohol use No  . Drug use: Unknown  . Sexual activity: Not on file   Other Topics Concern  . Not on file   Social History Narrative  . No narrative on file    Family History  Problem Relation Age of Onset  . Diabetes Daughter   . Hypertension Daughter   . CAD Brother     ROS: no fevers or chills, productive cough, hemoptysis, dysphasia, odynophagia, melena, hematochezia, dysuria, hematuria, rash, seizure activity, orthopnea, PND, pedal edema, claudication. Remaining systems are negative.  Physical Exam:   Blood pressure (!) 150/76, pulse 78, height 5\' 2"  (1.575 m), weight 130 lb (59 kg).  General:  Well developed/well nourished in NAD Skin warm/dry Patient not depressed No peripheral clubbing Back-normal HEENT-normal/normal eyelids Neck supple/normal carotid upstroke bilaterally; bilateral bruits; no JVD; no thyromegaly chest - CTA/ normal expansion CV - RRR/normal S1 and S2; no rubs or gallops;  PMI nondisplaced; 2/6 systolic murmur LSB Abdomen -NT/ND, no HSM, no mass, + bowel sounds, no bruit 1+ femoral pulses, no bruits Ext-no edema, chords; diminished distal pulses Neuro-grossly nonfocal  ECG - 11/26/2015-sinus rhythm, RV conduction delay, nonspecific  ST changes.  A/P  1 chest pain-patient had chest pain and dyspnea when she was severely anemic. Her symptoms have resolved following transfusion and correction of her anemia. Multiple risk factors and history of vascular disease. Plan Lexiscan nuclear study for risk stratification.   2 carotid bruits-schedule ultrasound to exclude carotid disease.   3 peripheral vascular disease-continue aspirin and statin.   4 hyperlipidemia-continue statin but increase Lipitor to 40 mg  daily. Check lipids and liver in 4 weeks.   5. Pulmonary hypertension-patient has a 75-pack-year history of tobacco abuse. Pulmonary hypertension is likely secondary to lung disease. There may also be a contribution from her mitral stenosis and valvular heart disease.   6 microcytic anemia-improved. She declines colonoscopy. Management per primary care.   7 hypertension-blood pressure borderline. Continue present medications and follow-up. Increase as needed.   Olga Millers, MD

## 2016-03-12 ENCOUNTER — Ambulatory Visit (INDEPENDENT_AMBULATORY_CARE_PROVIDER_SITE_OTHER): Payer: Medicare Other | Admitting: Cardiology

## 2016-03-12 ENCOUNTER — Encounter: Payer: Self-pay | Admitting: Cardiology

## 2016-03-12 VITALS — BP 150/76 | HR 78 | Ht 62.0 in | Wt 130.0 lb

## 2016-03-12 DIAGNOSIS — R0989 Other specified symptoms and signs involving the circulatory and respiratory systems: Secondary | ICD-10-CM

## 2016-03-12 DIAGNOSIS — E785 Hyperlipidemia, unspecified: Secondary | ICD-10-CM

## 2016-03-12 DIAGNOSIS — R0609 Other forms of dyspnea: Secondary | ICD-10-CM

## 2016-03-12 DIAGNOSIS — I10 Essential (primary) hypertension: Secondary | ICD-10-CM | POA: Diagnosis not present

## 2016-03-12 MED ORDER — ATORVASTATIN CALCIUM 80 MG PO TABS: 80.0000 mg | ORAL_TABLET | Freq: Every day | ORAL | 3 refills | Status: AC

## 2016-03-12 NOTE — Patient Instructions (Signed)
Medication Instructions:   INCREASE ATORVASTATIN TO 80 MG ONCE DAILY= 4 OF THE 20 MG TABLETS ONCE DAILY  Labwork:  Your physician recommends that you return for lab work in: 4 WEEKS AFTER INCREASING ATORVASTATIN= DO NOT EAT PRIOR TO LAB WORK  Testing/Procedures:  Your physician has requested that you have a lexiscan myoview. For further information please visit [email or web address]. Please follow instruction sheet, as given.   Your physician has requested that you have a carotid duplex. This test is an ultrasound of the carotid arteries in your neck. It looks at blood flow through these arteries that supply the brain with blood. Allow one hour for this exam. There are no restrictions or special instructions.    Follow-Up:  Your physician wants you to follow-up in: 6 MONTHS WITH DR Jens SomRENSHAW You will receive a reminder letter in the mail two months in advance. If you don't receive a letter, please call our office to schedule the follow-up appointment.   If you need a refill on your cardiac medications before your next appointment, please call your pharmacy.

## 2016-03-19 ENCOUNTER — Telehealth (HOSPITAL_COMMUNITY): Payer: Self-pay

## 2016-03-19 NOTE — Telephone Encounter (Signed)
Encounter complete. 

## 2016-03-21 ENCOUNTER — Ambulatory Visit (HOSPITAL_COMMUNITY)
Admission: RE | Admit: 2016-03-21 | Discharge: 2016-03-21 | Disposition: A | Discharge status: Home / Self Care | Payer: Medicare Other | Source: Ambulatory Visit | Attending: Cardiology | Admitting: Cardiology

## 2016-03-21 DIAGNOSIS — R0989 Other specified symptoms and signs involving the circulatory and respiratory systems: Secondary | ICD-10-CM | POA: Diagnosis present

## 2016-03-21 DIAGNOSIS — Z87891 Personal history of nicotine dependence: Secondary | ICD-10-CM | POA: Insufficient documentation

## 2016-03-21 DIAGNOSIS — I6523 Occlusion and stenosis of bilateral carotid arteries: Secondary | ICD-10-CM | POA: Diagnosis not present

## 2016-03-21 DIAGNOSIS — R0609 Other forms of dyspnea: Secondary | ICD-10-CM

## 2016-03-21 DIAGNOSIS — I10 Essential (primary) hypertension: Secondary | ICD-10-CM | POA: Insufficient documentation

## 2016-03-21 DIAGNOSIS — E785 Hyperlipidemia, unspecified: Secondary | ICD-10-CM | POA: Insufficient documentation

## 2016-03-21 DIAGNOSIS — I272 Pulmonary hypertension, unspecified: Secondary | ICD-10-CM | POA: Diagnosis not present

## 2016-03-21 DIAGNOSIS — I700 Atherosclerosis of aorta: Secondary | ICD-10-CM | POA: Diagnosis not present

## 2016-03-21 DIAGNOSIS — Z8249 Family history of ischemic heart disease and other diseases of the circulatory system: Secondary | ICD-10-CM | POA: Diagnosis not present

## 2016-03-21 LAB — MYOCARDIAL PERFUSION IMAGING
CHL CUP NUCLEAR SRS: 1
CHL CUP NUCLEAR SSS: 2
LV sys vol: 14 mL
LVDIAVOL: 44 mL (ref 46–106)
NUC STRESS TID: 1.1
Peak HR: 96 {beats}/min
Rest HR: 74 {beats}/min
SDS: 1

## 2016-03-21 MED ORDER — TECHNETIUM TC 99M TETROFOSMIN IV KIT
30.5000 | PACK | Freq: Once | INTRAVENOUS | Status: AC | PRN
  Administered 2016-03-21: 30.5 via INTRAVENOUS
  Filled 2016-03-21: qty 31

## 2016-03-21 MED ORDER — REGADENOSON 0.4 MG/5ML IV SOLN
0.4000 mg | Freq: Once | INTRAVENOUS | Status: AC
  Administered 2016-03-21: 0.4 mg via INTRAVENOUS

## 2016-03-21 MED ORDER — TECHNETIUM TC 99M TETROFOSMIN IV KIT
10.8000 | PACK | Freq: Once | INTRAVENOUS | Status: AC | PRN
  Administered 2016-03-21: 10.8 via INTRAVENOUS
  Filled 2016-03-21: qty 11

## 2016-05-09 ENCOUNTER — Other Ambulatory Visit: Payer: Self-pay | Admitting: Osteopathic Medicine

## 2016-06-18 ENCOUNTER — Encounter: Payer: Self-pay | Admitting: *Deleted

## 2016-06-27 ENCOUNTER — Telehealth: Payer: Self-pay | Admitting: Cardiology

## 2016-06-27 DIAGNOSIS — E785 Hyperlipidemia, unspecified: Secondary | ICD-10-CM

## 2016-06-27 NOTE — Telephone Encounter (Signed)
New Message    Needs lab orders faxed to [facility] on [address]  Office number [phone]

## 2016-06-27 NOTE — Telephone Encounter (Signed)
Called patient and made aware that orders were changed to resulting agency [facility].  Pt verbalized understanding.

## 2016-07-05 LAB — HEPATIC FUNCTION PANEL
ALT: 13 IU/L (ref 0–32)
AST: 20 IU/L (ref 0–40)
Albumin: 3.9 g/dL (ref 3.5–4.7)
Alkaline Phosphatase: 76 IU/L (ref 39–117)
Bilirubin Total: 0.5 mg/dL (ref 0.0–1.2)
Bilirubin, Direct: 0.12 mg/dL (ref 0.00–0.40)
Total Protein: 7.3 g/dL (ref 6.0–8.5)

## 2016-07-05 LAB — LIPID PANEL
CHOLESTEROL TOTAL: 249 mg/dL — AB (ref 100–199)
Chol/HDL Ratio: 5.2 ratio units — ABNORMAL HIGH (ref 0.0–4.4)
HDL: 48 mg/dL (ref >39)
LDL Calculated: 169 mg/dL — ABNORMAL HIGH (ref 0–99)
TRIGLYCERIDES: 162 mg/dL — AB (ref 0–149)
VLDL Cholesterol Cal: 32 mg/dL (ref 5–40)

## 2016-07-07 ENCOUNTER — Telehealth: Payer: Self-pay | Admitting: *Deleted

## 2016-07-07 DIAGNOSIS — E7800 Pure hypercholesterolemia, unspecified: Secondary | ICD-10-CM

## 2016-07-07 NOTE — Telephone Encounter (Signed)
Spoke with pt, she reports she has not taken atorvastatin for one month. She just quit taking it. Discussed lab results in detail and the patient will restart atorvastatin 80 mg once daily. Lab orders mailed to the pt.

## 2016-07-07 NOTE — Telephone Encounter (Addendum)
-----   Message from Lewayne BuntingBrian S Crenshaw, MD sent at 07/07/2016  7:14 AM EST ----- Change lipitor to 80 mg daily lipids and liver 4 weeks Olga MillersBrian Crenshaw  Left message for pt to call

## 2016-08-12 ENCOUNTER — Other Ambulatory Visit: Payer: Self-pay | Admitting: Osteopathic Medicine

## 2016-08-12 DIAGNOSIS — I208 Other forms of angina pectoris: Secondary | ICD-10-CM

## 2016-08-22 ENCOUNTER — Encounter: Payer: Self-pay | Admitting: *Deleted

## 2016-09-13 ENCOUNTER — Other Ambulatory Visit: Payer: Self-pay | Admitting: Osteopathic Medicine

## 2016-09-13 DIAGNOSIS — I208 Other forms of angina pectoris: Secondary | ICD-10-CM

## 2016-10-12 ENCOUNTER — Other Ambulatory Visit: Payer: Self-pay | Admitting: Osteopathic Medicine

## 2016-10-12 DIAGNOSIS — I208 Other forms of angina pectoris: Secondary | ICD-10-CM

## 2016-11-14 ENCOUNTER — Other Ambulatory Visit: Payer: Self-pay | Admitting: Osteopathic Medicine

## 2016-11-14 DIAGNOSIS — I208 Other forms of angina pectoris: Secondary | ICD-10-CM

## 2016-11-20 ENCOUNTER — Telehealth: Payer: Self-pay | Admitting: Osteopathic Medicine

## 2016-11-20 NOTE — Telephone Encounter (Signed)
Called patient and left a message for her to call the office and schedule a f/u appt with Dr.Alexander on her BP

## 2016-12-05 ENCOUNTER — Other Ambulatory Visit: Payer: Self-pay | Admitting: Osteopathic Medicine

## 2016-12-05 DIAGNOSIS — I2089 Other forms of angina pectoris: Secondary | ICD-10-CM

## 2016-12-05 DIAGNOSIS — I208 Other forms of angina pectoris: Secondary | ICD-10-CM

## 2017-01-12 ENCOUNTER — Other Ambulatory Visit: Payer: Self-pay | Admitting: Osteopathic Medicine

## 2017-01-12 DIAGNOSIS — I208 Other forms of angina pectoris: Secondary | ICD-10-CM

## 2017-01-28 ENCOUNTER — Other Ambulatory Visit: Payer: Self-pay | Admitting: Osteopathic Medicine

## 2017-02-08 ENCOUNTER — Other Ambulatory Visit: Payer: Self-pay | Admitting: Osteopathic Medicine

## 2017-02-08 DIAGNOSIS — I208 Other forms of angina pectoris: Secondary | ICD-10-CM

## 2017-03-10 ENCOUNTER — Telehealth: Payer: Self-pay

## 2017-03-10 NOTE — Telephone Encounter (Signed)
Patient called stated that she has found another provider that is closer to her and she says thank you for your services. Katrina Lane,CMA

## 2017-03-10 NOTE — Telephone Encounter (Signed)
Ok, can remove me as PCP

## 2017-03-27 ENCOUNTER — Other Ambulatory Visit: Payer: Self-pay | Admitting: *Deleted

## 2017-03-27 DIAGNOSIS — I679 Cerebrovascular disease, unspecified: Secondary | ICD-10-CM

## 2017-04-28 ENCOUNTER — Encounter (HOSPITAL_COMMUNITY): Payer: Medicare Other

## 2018-09-22 IMAGING — NM NM MISC PROCEDURE
6 series · 36 of 36 positions shown · non-contrast
Comparison: none

[Series 1: wbr_r-proj_st wbr rest · 6.40mm/px · 6 of 64 frames shown]
[frame 6/64]
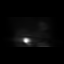
[frame 16/64]
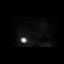
[frame 27/64]
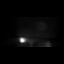
[frame 38/64]
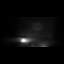
[frame 48/64]
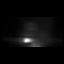
[frame 59/64]
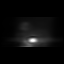

[Series 1: wbr rest · 6.40mm/px · 6 of 64 frames shown]
[frame 6/64]
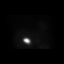
[frame 16/64]
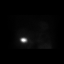
[frame 27/64]
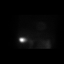
[frame 38/64]
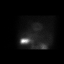
[frame 48/64]
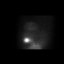
[frame 59/64]
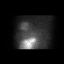

[Series 2: wbr_s-proj_st wbr stress-gsp · 6.40mm/px · 6 of 512 frames shown]
[frame 43/512]
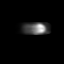
[frame 128/512]
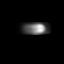
[frame 214/512]
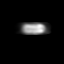
[frame 299/512]
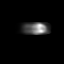
[frame 384/512]
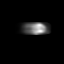
[frame 470/512]
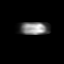

[Series 2: wbr stress-gsp · 6.40mm/px · 6 of 512 frames shown]
[frame 43/512]
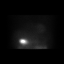
[frame 128/512]
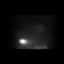
[frame 214/512]
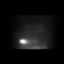
[frame 299/512]
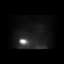
[frame 384/512]
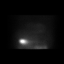
[frame 470/512]
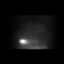

[Series 3: wbr_s-proj_st wbr stress-sum-em · 6.40mm/px · 6 of 64 frames shown]
[frame 6/64]
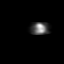
[frame 16/64]
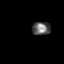
[frame 27/64]
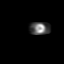
[frame 38/64]
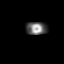
[frame 48/64]
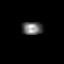
[frame 59/64]
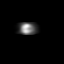

[Series 3: wbr stress-sum-em · 6.40mm/px · 6 of 64 frames shown]
[frame 6/64]
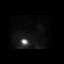
[frame 16/64]
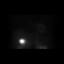
[frame 27/64]
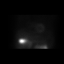
[frame 38/64]
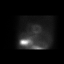
[frame 48/64]
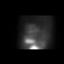
[frame 59/64]
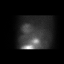

[36 of 36 positions shown; findings below may reference images not displayed]

Canned report from images found in remote index.

Refer to host system for actual result text.

## 2019-05-25 ENCOUNTER — Other Ambulatory Visit: Payer: Self-pay

## 2019-05-25 ENCOUNTER — Ambulatory Visit (INDEPENDENT_AMBULATORY_CARE_PROVIDER_SITE_OTHER): Payer: Medicare PPO | Admitting: Pulmonary Disease

## 2019-05-25 ENCOUNTER — Encounter: Payer: Self-pay | Admitting: Pulmonary Disease

## 2019-05-25 VITALS — BP 114/56 | HR 83 | Temp 97.4°F | Ht 62.0 in | Wt 107.0 lb

## 2019-05-25 DIAGNOSIS — J438 Other emphysema: Secondary | ICD-10-CM

## 2019-05-25 DIAGNOSIS — J9611 Chronic respiratory failure with hypoxia: Secondary | ICD-10-CM | POA: Diagnosis not present

## 2019-05-25 DIAGNOSIS — Z23 Encounter for immunization: Secondary | ICD-10-CM | POA: Diagnosis not present

## 2019-05-25 DIAGNOSIS — Z7189 Other specified counseling: Secondary | ICD-10-CM | POA: Diagnosis not present

## 2019-05-25 DIAGNOSIS — J449 Chronic obstructive pulmonary disease, unspecified: Secondary | ICD-10-CM | POA: Insufficient documentation

## 2019-05-25 MED ORDER — TRELEGY ELLIPTA 100-62.5-25 MCG/INH IN AEPB: 1.0000 | INHALATION_SPRAY | Freq: Every day | RESPIRATORY_TRACT | 0 refills | Status: AC

## 2019-05-25 NOTE — Progress Notes (Signed)
Subjective:   PATIENT ID: Katrina Lane GENDER: female DOB: 1929-05-24, MRN: [id number]   HPI  Chief Complaint  Patient presents with  . Consult    Referred by Kalman Drape for emphysema, lung nodules, hypoxia.     Reason for Visit: New consult for pulmonary nodules, chronic respiratory failure  Katrina Lane is a 86 year old female former smoker with 83-pack-year history with COPD/emphysema, chronic hypoxic respiratory failure, moderate to severe pulmonary hypertension, pulmonary nodules, PAD, hypertension, mitral regurgitation, tricuspid insufficiency and CKD stage III who presents as a new patient to establish care with a pulmonologist.  Daughter is present to provide history.  She was referred by Ladora Daniel, PA.  She recently established care with family medicine on 05/04/2019.  Patient has recently moved in with her daughter.  When she previously lived alone she has been nonadherent to medications.  Since moving in, her daughter is helped her with medications and meals.  During her office visit she was noted to be 88% at rest and was prescribed 2 L of oxygen.  She also had a CT chest ordered for history of pulmonary nodules.  She has been referred to pulmonary for further management  She reports that she has been living with her daughter for the last month.  Her daughter has been helping a lot with medications and mealtimes.  At baseline she is not very active and lays in bed most the day.  She will take her meals sitting at the edge of the bed on a TV tray.  She has severe dyspnea on exertion which has worsened compared to 3 years ago when she was able to walk 230 feet on 6-minute walk test.  She will take her Incruse inhaler 3-4 times a week, often forgetting to take it on the other days.  Her main symptom is dyspnea that can be associated with cough.  Minimal wheezing.  She has expressed that she is not interested in further chest imaging for pulmonary nodules.  She also states  that she wishes to avoid being on a ventilator if possible.  She has had a friend on a ventilator and does not wish that for herself.  I have personally reviewed patient's past medical/family/social history, allergies, current medications.  Past Medical History:  Diagnosis Date  . Anemia    RECEIVED 2 UNITS OF PRBC  . Chest pain   . Hyperlipidemia   . Hypertension    PULMONARY  . PVD (peripheral vascular disease) (HCC)   . SOB (shortness of breath)      Family History  Problem Relation Age of Onset  . Diabetes Daughter   . Hypertension Daughter   . CAD Brother      Social History   Occupational History  . Not on file  Tobacco Use  . Smoking status: Former Smoker    Packs/day: 1.50    Years: 55.00    Pack years: 82.50    Types: Cigarettes    Quit date: 09/25/1991    Years since quitting: 27.6  . Smokeless tobacco: Never Used  Substance and Sexual Activity  . Alcohol use: No    Alcohol/week: 0.0 standard drinks  . Drug use: Not on file  . Sexual activity: Not on file    No Known Allergies   Outpatient Medications Prior to Visit  Medication Sig Dispense Refill  . aspirin 81 MG tablet Take 81 mg by mouth daily.    . metoprolol succinate (TOPROL-XL) 50 MG 24 hr tablet Take  1 tablet (50 mg total) by mouth daily. Patient needs to schedule a follow up appointment with PCP before more refills. 15 tablet 0  . umeclidinium bromide (INCRUSE ELLIPTA) 62.5 MCG/INH AEPB Inhale 1 puff into the lungs daily.    Marland Kitchen atorvastatin (LIPITOR) 80 MG tablet Take 1 tablet (80 mg total) by mouth daily. TAKE 1 TABLET BY MOUTH DAILY (Patient not taking: Reported on 05/25/2019) 90 tablet 3  . FEROSUL 325 (65 Fe) MG tablet TAKE 1 TABLET(325 MG) BY MOUTH TWICE DAILY (Patient not taking: Reported on 05/25/2019) 60 tablet 0  . furosemide (LASIX) 20 MG tablet Take 1 tablet (20 mg total) by mouth 2 (two) times daily. (Patient not taking: Reported on 05/25/2019) 30 tablet 3  . metoprolol succinate (TOPROL-XL)  50 MG 24 hr tablet Take 25 mg by mouth daily.     . metoprolol succinate (TOPROL-XL) 50 MG 24 hr tablet TAKE 1 TABLET(50MG  TOTAL) BY MOUTH DAILY. NEED APPT 15 tablet 0   No facility-administered medications prior to visit.    Review of Systems  Constitutional: Positive for malaise/fatigue. Negative for chills, diaphoresis, fever and weight loss.  HENT: Negative for congestion, ear pain and sore throat.   Respiratory: Negative for cough, hemoptysis, sputum production, shortness of breath and wheezing.   Cardiovascular: Negative for chest pain, palpitations and leg swelling.  Gastrointestinal: Negative for abdominal pain, heartburn and nausea.  Genitourinary: Negative for frequency.  Musculoskeletal: Negative for joint pain and myalgias.  Skin: Negative for itching and rash.  Neurological: Negative for dizziness, weakness and headaches.  Endo/Heme/Allergies: Does not bruise/bleed easily.  Psychiatric/Behavioral: Negative for depression. The patient is not nervous/anxious.      Objective:   Vitals:   05/25/19 1530  BP: (!) 114/56  Pulse: 83  Temp: (!) 97.4 F (36.3 C)  TempSrc: Temporal  SpO2: 90%  Weight: 107 lb (48.5 kg)  Height: 5\' 2"  (1.575 m)   SpO2: 90 % O2 Device: None (Room air)  Physical Exam: General: Elderly and frail-appearing, no acute distress HENT: [place], AT Eyes: EOMI, no scleral icterus Respiratory: Diminished breath sounds bilaterally.  No crackles, wheezing or rales Cardiovascular: RRR, -M/R/G, no JVD GI: BS+, soft, nontender Extremities:-Edema,-tenderness Neuro: AAO x4, CNII-XII grossly intact Skin: Intact, no rashes or bruising Psych: Normal mood, normal affect  Data Reviewed:  Imaging: [facility] CT chest 04/02/2017 (report only) 1. Right upper lobe nodule measuring 7 mm with mildly irregular margins, should be viewed with suspicion given background emphysema. Still below typical size threshold for resolution on PET. Scattered smaller nodules.  Recommend follow-up CT in 3-6 months  to assess stability. 2. Mildly dilated pulmonary trunk suggesting pulmonary arterial hypertension. 3. There is severe atherosclerotic disease of the aorta and coronary arteries. 4. Minimal right pleural effusion. 5. No findings of pulmonary fibrosis.  PFT: [facility] 12/31/2017 FVC 0.81 (37%) FEV1 0.5 (35%) Ratio 62 TLC 59% DLCO 31% Interpretation: Mixed obstructive and restrictive defect with severely reduced DLCO  6-minute walk test [facility] 03/26/2017-able to walk 230 m without significant dyspnea.  [place]ir O2 desaturation 87% which improved with 2 L nasal cannula  Assessment & Plan:   Discussion: 86 year old female former smoker with 83-pack-year history with COPD/emphysema, chronic hypoxic respiratory failure, moderate to severe pulmonary hypertension, pulmonary nodules, PAD, hypertension, mitral regurgitation, tricuspid insufficiency and CKD stage III who presents as a new patient.  Emphysema STOP Incruse START Trelegy ONE puff ONCE a day  Exertional hypoxemia Recommend 2-3L oxygen via nasal cannula with exertion and  sleep You can purchase a PULSE OXIMETER to measure oxygen levels  Goal oxygen level >88%. Please call if less than 88% for over five minutes  Pulmonary nodules Patient does not wish to pursue any imaging. She does not plan to seek treatment if lesions are abnormal.  Goals of Care She does not wish to be resuscitated or be on a ventilator. I provided MOST form to discuss your future health care wishes. Please discuss this with your family  Health Maintenance Immunization History  Administered Date(s) Administered  . Influenza, High Dose Seasonal PF 06/04/2017, 03/04/2018  . Influenza-Unspecified 03/19/2018  . Pneumococcal Conjugate-13 06/04/2017  . Pneumococcal Polysaccharide-23 07/14/2018   CT Lung Screen - not indicated  No orders of the defined types were placed in this encounter.  Meds ordered this  encounter  Medications  . Fluticasone-Umeclidin-Vilant (TRELEGY ELLIPTA) 100-62.5-25 MCG/INH AEPB    Sig: Inhale 1 puff into the lungs daily.    Dispense:  1 each    Refill:  0    Order Specific Question:   Lot Number?    Answer:   [id number]    Order Specific Question:   Expiration Date?    Answer:   07/17/2020    Order Specific Question:   Manufacturer?    Answer:   [employer] [[id number]]    Order Specific Question:   Quantity    Answer:   1    Return in about 3 months (around 08/23/2019).  I have spent a total time of 70-minutes on the day of the appointment reviewing prior documentation, coordinating care and discussing medical diagnosis and plan with the patient/family. Imaging, labs and tests included in this note have been reviewed and interpreted independently by me.  Jessie, MD [facility] 05/25/2019 4:35 PM  Office Number [phone]

## 2019-05-25 NOTE — Patient Instructions (Signed)
Emphysema STOP Incruse START Trelegy ONE puff ONCE a day. Samples and prescription provided  Exertional hypoxemia Recommend 2-3L oxygen via nasal cannula with exertion and sleep You can purchase a PULSE OXIMETER to measure oxygen levels  Goal oxygen level >88%. Please call if less than 88% for over five minutes  Pulmonary nodules Patient does not wish to pursue any imaging. She does not plan to seek treatment if lesions are abnormal.  Goals of Care She does not wish to be resuscitated or be on a ventilator. I provided MOST form to discuss your future health care wishes. Please discuss this with your family

## 2019-07-18 DEATH — deceased
